# Patient Record
Sex: Female | Born: 1970 | Race: Black or African American | Hispanic: No | Marital: Single | Smoking: Current every day smoker
Health system: Southern US, Community
[De-identification: ages and names within clinical notes are randomized; demographics above are authoritative.]

## PROBLEM LIST (undated history)

## (undated) DIAGNOSIS — I441 Atrioventricular block, second degree: Secondary | ICD-10-CM

## (undated) DIAGNOSIS — J45909 Unspecified asthma, uncomplicated: Secondary | ICD-10-CM

---

## 2017-12-31 ENCOUNTER — Emergency Department
Admission: EM | Admit: 2017-12-31 | Discharge: 2017-12-31 | Disposition: A | Discharge status: Home / Self Care | Payer: Self-pay | Attending: Emergency Medicine | Admitting: Emergency Medicine

## 2017-12-31 DIAGNOSIS — Y929 Unspecified place or not applicable: Secondary | ICD-10-CM | POA: Insufficient documentation

## 2017-12-31 DIAGNOSIS — Y999 Unspecified external cause status: Secondary | ICD-10-CM | POA: Insufficient documentation

## 2017-12-31 DIAGNOSIS — Z5321 Procedure and treatment not carried out due to patient leaving prior to being seen by health care provider: Secondary | ICD-10-CM | POA: Insufficient documentation

## 2017-12-31 DIAGNOSIS — S01511A Laceration without foreign body of lip, initial encounter: Secondary | ICD-10-CM | POA: Insufficient documentation

## 2017-12-31 DIAGNOSIS — Y939 Activity, unspecified: Secondary | ICD-10-CM | POA: Insufficient documentation

## 2017-12-31 DIAGNOSIS — W19XXXA Unspecified fall, initial encounter: Secondary | ICD-10-CM | POA: Insufficient documentation

## 2017-12-31 HISTORY — DX: Unspecified asthma, uncomplicated: J45.909

## 2017-12-31 NOTE — ED Triage Notes (Signed)
Patient reports mechanical fall. Patient has multiple lacerations to inner bottom lip. patient c/o pain to area. Patient denies LOC, dizziness, lightheadedness.

## 2018-01-12 ENCOUNTER — Other Ambulatory Visit: Payer: Self-pay

## 2018-01-12 ENCOUNTER — Emergency Department
Admission: EM | Admit: 2018-01-12 | Discharge: 2018-01-12 | Disposition: A | Discharge status: Home / Self Care | Payer: Self-pay | Attending: Emergency Medicine | Admitting: Emergency Medicine

## 2018-01-12 ENCOUNTER — Emergency Department: Payer: Self-pay

## 2018-01-12 DIAGNOSIS — S29012A Strain of muscle and tendon of back wall of thorax, initial encounter: Secondary | ICD-10-CM | POA: Insufficient documentation

## 2018-01-12 DIAGNOSIS — Y92410 Unspecified street and highway as the place of occurrence of the external cause: Secondary | ICD-10-CM | POA: Insufficient documentation

## 2018-01-12 DIAGNOSIS — F17200 Nicotine dependence, unspecified, uncomplicated: Secondary | ICD-10-CM | POA: Insufficient documentation

## 2018-01-12 DIAGNOSIS — R0789 Other chest pain: Secondary | ICD-10-CM | POA: Insufficient documentation

## 2018-01-12 DIAGNOSIS — Y9389 Activity, other specified: Secondary | ICD-10-CM | POA: Insufficient documentation

## 2018-01-12 DIAGNOSIS — Z91040 Latex allergy status: Secondary | ICD-10-CM | POA: Insufficient documentation

## 2018-01-12 DIAGNOSIS — J45909 Unspecified asthma, uncomplicated: Secondary | ICD-10-CM | POA: Insufficient documentation

## 2018-01-12 DIAGNOSIS — R0781 Pleurodynia: Secondary | ICD-10-CM | POA: Insufficient documentation

## 2018-01-12 DIAGNOSIS — Y999 Unspecified external cause status: Secondary | ICD-10-CM | POA: Insufficient documentation

## 2018-01-12 DIAGNOSIS — M25519 Pain in unspecified shoulder: Secondary | ICD-10-CM | POA: Insufficient documentation

## 2018-01-12 HISTORY — DX: Atrioventricular block, second degree: I44.1

## 2018-01-12 MED ORDER — LORAZEPAM 2 MG/ML IJ SOLN
1.0000 mg | Freq: Once | INTRAMUSCULAR | Status: AC
  Administered 2018-01-12: 1 mg via INTRAVENOUS
  Filled 2018-01-12: qty 1

## 2018-01-12 MED ORDER — NAPROXEN 500 MG PO TABS: 500.0000 mg | ORAL_TABLET | Freq: Two times a day (BID) | ORAL | 0 refills | Status: DC

## 2018-01-12 MED ORDER — DIAZEPAM 5 MG PO TABS: 5.0000 mg | ORAL_TABLET | Freq: Every day | ORAL | 0 refills | Status: AC

## 2018-01-12 MED ORDER — KETOROLAC TROMETHAMINE 30 MG/ML IJ SOLN
15.0000 mg | INTRAMUSCULAR | Status: AC
  Administered 2018-01-12: 15 mg via INTRAVENOUS
  Filled 2018-01-12: qty 1
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
## 2018-01-12 NOTE — ED Notes (Signed)
Pt is drowsy after medication. She is up for d/c and will be monitored until her sister arrives to take her home.
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
## 2018-05-31 ENCOUNTER — Other Ambulatory Visit: Payer: Self-pay

## 2018-05-31 ENCOUNTER — Emergency Department
Admission: EM | Admit: 2018-05-31 | Discharge: 2018-05-31 | Disposition: A | Discharge status: Home / Self Care | Payer: 59 | Attending: Student in an Organized Health Care Education/Training Program | Admitting: Student in an Organized Health Care Education/Training Program

## 2018-05-31 ENCOUNTER — Encounter: Payer: Self-pay | Admitting: Emergency Medicine

## 2018-05-31 DIAGNOSIS — Y929 Unspecified place or not applicable: Secondary | ICD-10-CM | POA: Insufficient documentation

## 2018-05-31 DIAGNOSIS — F17200 Nicotine dependence, unspecified, uncomplicated: Secondary | ICD-10-CM | POA: Diagnosis not present

## 2018-05-31 DIAGNOSIS — S8992XA Unspecified injury of left lower leg, initial encounter: Secondary | ICD-10-CM | POA: Diagnosis present

## 2018-05-31 DIAGNOSIS — Z91040 Latex allergy status: Secondary | ICD-10-CM | POA: Diagnosis not present

## 2018-05-31 DIAGNOSIS — Y999 Unspecified external cause status: Secondary | ICD-10-CM | POA: Insufficient documentation

## 2018-05-31 DIAGNOSIS — S76312A Strain of muscle, fascia and tendon of the posterior muscle group at thigh level, left thigh, initial encounter: Secondary | ICD-10-CM | POA: Insufficient documentation

## 2018-05-31 DIAGNOSIS — X509XXA Other and unspecified overexertion or strenuous movements or postures, initial encounter: Secondary | ICD-10-CM | POA: Insufficient documentation

## 2018-05-31 DIAGNOSIS — Y939 Activity, unspecified: Secondary | ICD-10-CM | POA: Insufficient documentation

## 2018-05-31 DIAGNOSIS — J45909 Unspecified asthma, uncomplicated: Secondary | ICD-10-CM | POA: Diagnosis not present

## 2018-05-31 MED ORDER — CYCLOBENZAPRINE HCL 5 MG PO TABS: 5.0000 mg | ORAL_TABLET | Freq: Three times a day (TID) | ORAL | 0 refills | Status: AC | PRN

## 2018-05-31 MED ORDER — NABUMETONE 750 MG PO TABS: 750.0000 mg | ORAL_TABLET | Freq: Two times a day (BID) | ORAL | 0 refills | Status: AC

## 2018-05-31 NOTE — ED Notes (Signed)

## 2018-05-31 NOTE — ED Provider Notes (Signed)
[facility] Emergency Department Provider Note ____________________________________________  Time seen: 1514  I have reviewed the triage vital signs and the nursing notes.  HISTORY  Chief Complaint  Back Pain and Leg Pain  HPI Shari Fischer is a 48 y.o. female presents herself to the ED for evaluation of left hamstring pain.  Patient describes sudden injury yesterday while she was outside playing with her nieces and nephews.  She describes running, when she felt an immediate pop to the posterior aspect of her thigh near the buttocks.  She denies any fall, trip, or other injury at this time.  She describes disability since that time, but denies any foot drop, bladder or bowel incontinence, or leg swelling.  She also denies any chest pain, shortness of breath, or syncope.  She has not done any alleviating measures for her pain since the onset.  She presents now for further evaluation of a posterior left thigh injury.  Past Medical History:  Diagnosis Date  . Asthma   . Wenckebach     There are no active problems to display for this patient.   Past Surgical History:  Procedure Laterality Date  . CESAREAN SECTION      Prior to Admission medications   Medication Sig Start Date End Date Taking? Authorizing Provider  cyclobenzaprine (FLEXERIL) 5 MG tablet Take 1 tablet (5 mg total) by mouth 3 (three) times daily as needed for muscle spasms. 05/31/18   Destry Dauber, Charlesetta Ivory, PA-C  nabumetone (RELAFEN) 750 MG tablet Take 1 tablet (750 mg total) by mouth 2 (two) times daily. 05/31/18   Thresea Doble, Charlesetta Ivory, PA-C    Allergies Latex and Percocet [oxycodone-acetaminophen]  No family history on file.  Social History Social History   Tobacco Use  . Smoking status: Current Every Day Smoker  . Smokeless tobacco: Never Used  Substance Use Topics  . Alcohol use: Yes  . Drug use: Not on file    Review of Systems  Constitutional: Negative for  fever. Cardiovascular: Negative for chest pain. Respiratory: Negative for shortness of breath. Gastrointestinal: Negative for abdominal pain, vomiting and diarrhea. Genitourinary: Negative for dysuria. Musculoskeletal: Negative for back pain.  Pain as above. Skin: Negative for rash. Neurological: Negative for headaches, focal weakness or numbness. ____________________________________________  PHYSICAL EXAM:  VITAL SIGNS: ED Triage Vitals  Enc Vitals Group     BP 05/31/18 1316 (!) 148/93     Pulse Rate 05/31/18 1316 100     Resp 05/31/18 1316 16     Temp 05/31/18 1316 98.1 F (36.7 C)     Temp Source 05/31/18 1316 Oral     SpO2 05/31/18 1316 96 %     Weight 05/31/18 1312 145 lb (65.8 kg)     Height 05/31/18 1312 5\' 3"  (1.6 m)     Head Circumference --      Peak Flow --      Pain Score 05/31/18 1312 7     Pain Loc --      Pain Edu? --      Excl. in GC? --     Constitutional: Alert and oriented. Well appearing and in no distress. Head: Normocephalic and atraumatic. Eyes: Conjunctivae are normal. Normal extraocular movements Cardiovascular: Normal rate, regular rhythm. Normal distal pulses. Respiratory: Normal respiratory effort. No wheezes/rales/rhonchi. Musculoskeletal: Normal spinal alignment without midline tenderness, spasm, deformity, or step-off.  Patient with no obvious deformity to the left lower extremity.  No palpable cords, muscle defects, or hematoma to the  posterior thigh.  She is able demonstrate normal hip flexion and extension range.  In the prone position, patient is able to engage the hamstring muscle and flex the heel towards the buttocks.  Knee exam is benign at this time. nontender with normal range of motion in all extremities.  Neurologic: Mildly antalgic gait without ataxia.  Normal LE DTRs bilaterally.  Normal speech and language. No gross focal neurologic deficits are appreciated. Skin:  Skin is warm, dry and intact. No rash  noted. ___________________________________________   RADIOLOGY Not indicated ____________________________________________  PROCEDURES  Procedures ____________________________________________  INITIAL IMPRESSION / ASSESSMENT AND PLAN / ED COURSE Patient with ED evaluation of sudden left posterior thigh pain consistent with a hamstring strain.  Patient's exam is overall benign without any acute neuromuscular deficit.  Clinical exam is consistent with a mild hamstring strain.  Patient is discharged with instructions on the self-limited course of this injury.  She also is going to be discharged with prescriptions for Flexeril and Relafen.  She is referred to local community clinic for routine and interim follow-up.  A work note is provided for 1 day as requested. ____________________________________________  FINAL CLINICAL IMPRESSION(S) / ED DIAGNOSES  Final diagnoses:  Hamstring muscle strain, left, initial encounter      Lissa Hoard, PA-C 05/31/18 1625    Willy Eddy, MD 05/31/18 1840

## 2018-05-31 NOTE — Discharge Instructions (Signed)
Your exam is consistent with a hamstring muscle strain. Take the prescription meds as directed. Consider warm, epsom salt baths to relieve muscle pain. Follow-up with one of the local clinics for routine and follow-up medical care.

## 2018-05-31 NOTE — ED Triage Notes (Signed)
Says was running and felt a pop inl elft leg -back of thigh area --and back.

## 2019-08-01 ENCOUNTER — Other Ambulatory Visit: Payer: Self-pay

## 2019-08-01 ENCOUNTER — Encounter: Payer: Self-pay | Admitting: Emergency Medicine

## 2019-08-01 ENCOUNTER — Emergency Department
Admission: EM | Admit: 2019-08-01 | Discharge: 2019-08-01 | Disposition: A | Discharge status: Home / Self Care | Payer: 59 | Attending: Emergency Medicine | Admitting: Emergency Medicine

## 2019-08-01 DIAGNOSIS — Z79899 Other long term (current) drug therapy: Secondary | ICD-10-CM | POA: Insufficient documentation

## 2019-08-01 DIAGNOSIS — J45909 Unspecified asthma, uncomplicated: Secondary | ICD-10-CM | POA: Insufficient documentation

## 2019-08-01 DIAGNOSIS — Z91040 Latex allergy status: Secondary | ICD-10-CM | POA: Insufficient documentation

## 2019-08-01 DIAGNOSIS — F17210 Nicotine dependence, cigarettes, uncomplicated: Secondary | ICD-10-CM | POA: Insufficient documentation

## 2019-08-01 DIAGNOSIS — N63 Unspecified lump in unspecified breast: Secondary | ICD-10-CM | POA: Insufficient documentation

## 2019-08-01 MED ORDER — TRAMADOL HCL 50 MG PO TABS
50.0000 mg | ORAL_TABLET | Freq: Once | ORAL | Status: AC
  Administered 2019-08-01: 50 mg via ORAL
  Filled 2019-08-01: qty 1

## 2019-08-01 MED ORDER — HYDROCODONE-ACETAMINOPHEN 5-325 MG PO TABS: 1.0000 | ORAL_TABLET | Freq: Four times a day (QID) | ORAL | 0 refills | Status: AC | PRN

## 2019-08-01 MED ORDER — NAPROXEN 500 MG PO TABS
500.0000 mg | ORAL_TABLET | Freq: Once | ORAL | Status: AC
  Administered 2019-08-01: 23:00:00 500 mg via ORAL
  Filled 2019-08-01: qty 1

## 2019-08-01 MED ORDER — NAPROXEN 500 MG PO TABS: 500.0000 mg | ORAL_TABLET | Freq: Two times a day (BID) | ORAL | 0 refills | Status: AC

## 2019-08-01 NOTE — Discharge Instructions (Addendum)
Please call and schedule a follow-up appointment with gynecology.  Pain medication has been submitted to your pharmacy.  Please take that as prescribed.  Do not drive or operate any machinery if taking Norco.  I recommend that you wear a sports bra for support.  Return to the emergency department for symptoms that change or worsen if you are unable to schedule appointment.

## 2019-08-01 NOTE — ED Notes (Signed)
Pt states she noticed lump on left breast 4 weeks ago and today it is more painful. Pt resting in bed, warm blanket given.

## 2019-08-01 NOTE — ED Triage Notes (Signed)
Patient presents to the ED with a lump to her left breast that is very tender.  Patient states she noticed the area approx. 4 weeks ago.  Patient is in no obvious distress at this time.

## 2019-08-01 NOTE — ED Notes (Signed)
Pt called sister for ride while this RN present. Pt states sister is coming to pick her up.

## 2019-08-01 NOTE — ED Provider Notes (Signed)
[facility] Emergency Department Provider Note ____________________________________________   First MD Initiated Contact with Patient 08/01/19 2203     (approximate)  I have reviewed the triage vital signs and the nursing notes.   HISTORY  Chief Complaint Breast Mass  HPI Shari Fischer is a 49 y.o. female presents to the emergency department for evaluation of left breast tenderness.  She states that she noticed that 3 to 4 weeks ago.  It does not seem to change in relation to her menstrual cycle.  Her last menstrual cycle was the middle of April.  She has not noticed any discharge from her nipple or peeling around the nipple.  No immediate family history of breast cancer.  She has not been evaluated for this prior.  She does not have a regular doctor or gynecologist.  She does state that she had a breast cyst on that side several years ago that was drained but has not had any complications since then.         Past Medical History:  Diagnosis Date  . Asthma   . Wenckebach     There are no problems to display for this patient.   Past Surgical History:  Procedure Laterality Date  . CESAREAN SECTION      Prior to Admission medications   Medication Sig Start Date End Date Taking? Authorizing Provider  cyclobenzaprine (FLEXERIL) 5 MG tablet Take 1 tablet (5 mg total) by mouth 3 (three) times daily as needed for muscle spasms. 05/31/18   Menshew, Dannielle Karvonen, PA-C  HYDROcodone-acetaminophen (NORCO/VICODIN) 5-325 MG tablet Take 1 tablet by mouth every 6 (six) hours as needed for up to 3 days for severe pain. 08/01/19 08/04/19  Jesalyn Finazzo, Johnette Abraham B, FNP  nabumetone (RELAFEN) 750 MG tablet Take 1 tablet (750 mg total) by mouth 2 (two) times daily. 05/31/18   Menshew, Dannielle Karvonen, PA-C  naproxen (NAPROSYN) 500 MG tablet Take 1 tablet (500 mg total) by mouth 2 (two) times daily with a meal. 08/01/19   Beatric Fulop B, FNP    Allergies Latex and Percocet  [oxycodone-acetaminophen]  No family history on file.  Social History Social History   Tobacco Use  . Smoking status: Current Every Day Smoker  . Smokeless tobacco: Never Used  Substance Use Topics  . Alcohol use: Yes  . Drug use: Not on file    Review of Systems  Constitutional: No fever/chills Eyes: No visual changes. ENT: No sore throat. Cardiovascular: Denies chest pain. Respiratory: Denies shortness of breath. Gastrointestinal: No abdominal pain.  No nausea, no vomiting.  No diarrhea.  No constipation. Genitourinary: Negative for dysuria. Musculoskeletal: Negative for back pain. Skin: Positive for lump in left breast. Neurological: Negative for headaches, focal weakness or numbness. ____________________________________________   PHYSICAL EXAM:  VITAL SIGNS: ED Triage Vitals  Enc Vitals Group     BP 08/01/19 1902 (!) 169/111     Pulse Rate 08/01/19 1902 72     Resp 08/01/19 1902 16     Temp 08/01/19 1902 98.5 F (36.9 C)     Temp Source 08/01/19 1902 Oral     SpO2 08/01/19 1902 100 %     Weight 08/01/19 1903 147 lb (66.7 kg)     Height 08/01/19 1903 5' 3.5" (1.613 m)     Head Circumference --      Peak Flow --      Pain Score 08/01/19 1903 6     Pain Loc --  Pain Edu? --      Excl. in GC? --     Constitutional: Alert and oriented. Well appearing and in no acute distress. Eyes: Conjunctivae are normal. PERRL. EOMI. Head: Atraumatic. Nose: No congestion/rhinnorhea. Mouth/Throat: Mucous membranes are moist.  Oropharynx non-erythematous. Neck: No stridor.   Hematological/Lymphatic/Immunilogical: No cervical lymphadenopathy. Cardiovascular: Normal rate, regular rhythm. Grossly normal heart sounds.  Good peripheral circulation. Respiratory: Normal respiratory effort.  No retractions. Lungs CTAB. Gastrointestinal: Soft and nontender. No distention. No abdominal bruits. No CVA tenderness. Genitourinary:  Musculoskeletal: No lower extremity tenderness nor  edema.  No joint effusions. Neurologic:  Normal speech and language. No gross focal neurologic deficits are appreciated. No gait instability. Skin: Palpable, nonmobile mass within the areola of the left breast at approximately the 11 to 12 o'clock position. Psychiatric: Mood and affect are normal. Speech and behavior are normal.  ____________________________________________   LABS (all labs ordered are listed, but only abnormal results are displayed)  Labs Reviewed - No data to display ____________________________________________  EKG  Not indicated ____________________________________________  RADIOLOGY  ED MD interpretation:   Official radiology report(s): No results found.  ____________________________________________   PROCEDURES  Procedure(s) performed (including Critical Care):  Procedures  ____________________________________________   INITIAL IMPRESSION / ASSESSMENT AND PLAN     49 year old female presenting to the emergency department for treatment and evaluation of left breast pain.  See HPI for further details.  DIFFERENTIAL DIAGNOSIS  Fibrocystic changes, benign or cancerous mass, cyst  ED COURSE  No indication of abscess on exam but there is a palpable, nonmobile lump at the 11 to 12 o'clock position in the areola.  No discharge noted at the nipple.  No nipple cracking or bleeding.  Patient was advised that she will likely feel better if she wears a sports bra to help with support and compression.  She was strongly advised to schedule an appointment with gynecology and will be given a referral tonight.  She will be prescribed Norco and Naprosyn.  She is to return to the emergency department for acute changes of concern. ____________________________________________   FINAL CLINICAL IMPRESSION(S) / ED DIAGNOSES  Final diagnoses:  Breast lump or mass     ED Discharge Orders         Ordered    HYDROcodone-acetaminophen (NORCO/VICODIN) 5-325 MG  tablet  Every 6 hours PRN     08/01/19 2221    naproxen (NAPROSYN) 500 MG tablet  2 times daily with meals     08/01/19 2221           Shari Fischer was evaluated in Emergency Department on 08/01/2019 for the symptoms described in the history of present illness. She was evaluated in the context of the global COVID-19 pandemic, which necessitated consideration that the patient might be at risk for infection with the SARS-CoV-2 virus that causes COVID-19. Institutional protocols and algorithms that pertain to the evaluation of patients at risk for COVID-19 are in a state of rapid change based on information released by regulatory bodies including the CDC and federal and state organizations. These policies and algorithms were followed during the patient's care in the ED.   Note:  This document was prepared using Dragon voice recognition software and may include unintentional dictation errors.   Chinita Pester, FNP 08/01/19 2353    Emily Filbert, MD 08/02/19 1459

## 2019-08-02 ENCOUNTER — Other Ambulatory Visit: Payer: Self-pay | Admitting: Certified Nurse Midwife

## 2019-08-02 DIAGNOSIS — N6320 Unspecified lump in the left breast, unspecified quadrant: Secondary | ICD-10-CM

## 2019-08-28 ENCOUNTER — Other Ambulatory Visit: Payer: Self-pay

## 2019-08-28 ENCOUNTER — Ambulatory Visit: Payer: Self-pay | Attending: Oncology | Admitting: *Deleted

## 2019-08-28 ENCOUNTER — Encounter: Payer: Self-pay | Admitting: *Deleted

## 2019-08-28 VITALS — BP 147/99 | HR 77 | Temp 97.2°F | Ht 62.5 in | Wt 147.2 lb

## 2019-08-28 DIAGNOSIS — N644 Mastodynia: Secondary | ICD-10-CM

## 2019-08-28 NOTE — Progress Notes (Signed)
  Subjective:     Patient ID: Shari Fischer, female   DOB: 04-02-1970, 49 y.o.   MRN: [id number]  HPI   Review of Systems     Objective:   Physical Exam Chest:     Breasts:        Right: Inverted nipple present. No swelling, bleeding, mass, nipple discharge, skin change or tenderness.        Left: Nipple discharge, skin change and tenderness present. No swelling, bleeding, inverted nipple or mass.    Lymphadenopathy:     Upper Body:     Right upper body: No supraclavicular, axillary or pectoral adenopathy.     Left upper body: Axillary adenopathy present. No supraclavicular or pectoral adenopathy.        Assessment:     49 year old Black female referred to [facility] by Dr. Fay Records for a new lump in the left breast.  Patient states she felt "something wasn't just right back March".  States her breast became painful, and she saw Dr. Particia Nearing a few weeks ago and was referred to [place].  On clinical breast exam I can visualize an approximate 4-5 cm area of thickening and redness from 9-12:00 left breast.  The nipple areola complex is thick with a slight raised area at 9:00 in the areola.  The area was so painful to palpation, it was difficult to examine her breast thoroughly.  I could not palpate a discrete mass.  I can palpate an approximate 1 cm firm mobile lymph node in the left axilla.  The patient does not remember how long the area of concern has been red.  Patient states she had an area in the left breast to drain over the weekend.  There is no drainage on exam today.  Patient is tearful and states "I'm scared."  No family history of breast cancer.  Discussed that I felt it would be prudent to have her see a surgeon prior to scheduling her mammogram, so if this is possibly an infection it could be treated quickly, and if needed, the surgeon can also do a skin biopsy.  I don't think the patient could tolerate the mammogram compression at this time.  Explained that she will get scheduled for  a diagnostic mammogram and ultrasound.  She was encouraged to go by [facility] and sign a consent for release so her previous images from [facility] could obtained.  Patient has been screened for eligibility.  She does not have any insurance, Medicare or Medicaid.  She also meets financial eligibility.   Risk Assessment    Risk Scores      08/28/2019   Last edited by: Alta Corning, CMA   5-year risk: 1 %   Lifetime risk: 9.1 %            Plan:     Patient is scheduled to see Dr. Lemar Livings on 08/29/19 @ 11:15.  She was encouraged to arrive at least 15 minutes early.  Orders place for bilateral diagnostic mammogram and ultrasound.  Will get Joellyn Quails to schedule mammogram appointment.  Will follow up per [facility] protocol.

## 2019-08-28 NOTE — Patient Instructions (Signed)
Gave patient hand-out, Women Staying Healthy, Active and Well from [facility], with education on breast health, pap smears, heart and colon health. 

## 2019-08-29 ENCOUNTER — Encounter: Payer: Self-pay | Admitting: *Deleted

## 2019-08-29 NOTE — Progress Notes (Signed)
Talked to Asher Muir in the [facility].  She is going to get patients imaging from [facility] and call schedule patients diagnostic mammogram and ultrasound.

## 2019-08-30 ENCOUNTER — Inpatient Hospital Stay
Admission: RE | Admit: 2019-08-30 | Discharge: 2019-08-30 | Disposition: A | Discharge status: Home / Self Care | Payer: Self-pay | Source: Ambulatory Visit | Attending: *Deleted | Admitting: *Deleted

## 2019-08-30 ENCOUNTER — Other Ambulatory Visit: Payer: Self-pay | Admitting: *Deleted

## 2019-08-30 DIAGNOSIS — Z1231 Encounter for screening mammogram for malignant neoplasm of breast: Secondary | ICD-10-CM

## 2019-09-24 ENCOUNTER — Encounter: Payer: Self-pay | Admitting: *Deleted

## 2019-09-24 NOTE — Progress Notes (Signed)
Tried to call patient to schedule her mammogram.  No answer and no voicemail.  Will try again at a later time.

## 2019-10-07 ENCOUNTER — Encounter: Payer: Self-pay | Admitting: *Deleted

## 2019-10-07 NOTE — Progress Notes (Signed)
Patient has resolving left breast abscess.  She has not schedule her mammogram.  I have left messages, but she has not returned my calls.  Certified letter mailed to encourage her to schedule her mammogram.  If no response will close case as refusal to follow up.

## 2019-11-04 ENCOUNTER — Encounter: Payer: Self-pay | Admitting: *Deleted

## 2019-11-04 NOTE — Progress Notes (Signed)
Received unclaimed certified letter.  Will close case as lost to follow-up.
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
## 2022-11-01 ENCOUNTER — Other Ambulatory Visit: Payer: Self-pay

## 2022-11-01 ENCOUNTER — Encounter (HOSPITAL_COMMUNITY): Payer: Self-pay

## 2022-11-01 ENCOUNTER — Emergency Department (HOSPITAL_COMMUNITY)
Admission: EM | Admit: 2022-11-01 | Discharge: 2022-11-01 | Disposition: A | Discharge status: Home / Self Care | Payer: BLUE CROSS/BLUE SHIELD | Attending: Emergency Medicine | Admitting: Emergency Medicine

## 2022-11-01 DIAGNOSIS — Z91040 Latex allergy status: Secondary | ICD-10-CM | POA: Diagnosis not present

## 2022-11-01 DIAGNOSIS — Z79899 Other long term (current) drug therapy: Secondary | ICD-10-CM | POA: Diagnosis not present

## 2022-11-01 DIAGNOSIS — J45909 Unspecified asthma, uncomplicated: Secondary | ICD-10-CM | POA: Insufficient documentation

## 2022-11-01 DIAGNOSIS — I10 Essential (primary) hypertension: Secondary | ICD-10-CM | POA: Diagnosis present

## 2022-11-01 LAB — CBC WITH DIFFERENTIAL/PLATELET
Abs Immature Granulocytes: 0.01 10*3/uL (ref 0.00–0.07)
Basophils Absolute: 0 10*3/uL (ref 0.0–0.1)
Basophils Relative: 1 %
Eosinophils Absolute: 0.1 10*3/uL (ref 0.0–0.5)
Eosinophils Relative: 1 %
HCT: 43.1 % (ref 36.0–46.0)
Hemoglobin: 13.6 g/dL (ref 12.0–15.0)
Immature Granulocytes: 0 %
Lymphocytes Relative: 30 %
Lymphs Abs: 1.6 10*3/uL (ref 0.7–4.0)
MCH: 28.6 pg (ref 26.0–34.0)
MCHC: 31.6 g/dL (ref 30.0–36.0)
MCV: 90.5 fL (ref 80.0–100.0)
Monocytes Absolute: 0.5 10*3/uL (ref 0.1–1.0)
Monocytes Relative: 10 %
Neutro Abs: 3 10*3/uL (ref 1.7–7.7)
Neutrophils Relative %: 58 %
Platelets: 382 10*3/uL (ref 150–400)
RBC: 4.76 MIL/uL (ref 3.87–5.11)
RDW: 14.3 % (ref 11.5–15.5)
WBC: 5.2 10*3/uL (ref 4.0–10.5)
nRBC: 0 % (ref 0.0–0.2)

## 2022-11-01 LAB — BASIC METABOLIC PANEL
Anion gap: 9 (ref 5–15)
BUN: 9 mg/dL (ref 6–20)
CO2: 22 mmol/L (ref 22–32)
Calcium: 8.8 mg/dL — ABNORMAL LOW (ref 8.9–10.3)
Chloride: 107 mmol/L (ref 98–111)
Creatinine, Ser: 0.87 mg/dL (ref 0.44–1.00)
GFR, Estimated: >60 mL/min (ref >60)
Glucose, Bld: 99 mg/dL (ref 70–99)
Potassium: 3.9 mmol/L (ref 3.5–5.1)
Sodium: 138 mmol/L (ref 135–145)

## 2022-11-01 LAB — URINALYSIS, ROUTINE W REFLEX MICROSCOPIC
Bilirubin Urine: NEGATIVE
Glucose, UA: NEGATIVE mg/dL
Ketones, ur: NEGATIVE mg/dL
Leukocytes,Ua: NEGATIVE
Nitrite: NEGATIVE
Protein, ur: 30 mg/dL — AB
Specific Gravity, Urine: 1.013 (ref 1.005–1.030)
pH: 5 (ref 5.0–8.0)

## 2022-11-01 MED ORDER — AMLODIPINE BESYLATE 10 MG PO TABS: 10.0000 mg | ORAL_TABLET | Freq: Every day | ORAL | 2 refills | Status: AC

## 2022-11-01 NOTE — Discharge Instructions (Addendum)
It was a pleasure taking care of you today!  Your labs were unremarkable today. You will be sent a prescription for Amlodipine, take as directed. It is important that you keep a log of your blood pressure (see sheet attached) to give to your Primary Care Provider.  Attached is further information on how to take your blood pressure at home.  You may use an over-the-counter blood pressure monitor.  Return to emergency department if you experience increasing signs worsening headache, vision changes, chest pain, trouble breathing, worsening symptoms.   Ensure to maintain fluid intake.

## 2022-11-01 NOTE — ED Triage Notes (Signed)
Pt reports she was sent here by her PCP due to hypertension. Pt has no complaints and is not taking any blood pressure medication.

## 2022-11-01 NOTE — ED Provider Notes (Signed)
[place] EMERGENCY DEPARTMENT AT [facility] Provider Note   CSN: [id number] Arrival date & time: 11/01/22  1218     History  Chief Complaint  Patient presents with   Hypertension    Shari Fischer is a 52 y.o. female with a past medical history of asthma, wenckebach, who presents emergency department with concerns for elevated blood pressure onset today prior to arrival.  Was at her primary care provider's office for follow-up appointment when her blood pressure was elevated to 178 systolic.  Was informed by her primary care provider to come to the emergency department for a drip of antihypertensives due to her elevated blood pressure.  Patient denies chest pain, shortness of breath, abdominal pain, nausea, vomiting, numbness, tingling, dizziness, lightheadedness, headache, blurred vision, diplopia, urinary symptoms.   Per patient chart review: Patient was evaluated by her primary care provider today with a systolic blood pressure of 178.  Patient was not started on any antihypertensives by her primary care provider today.  Patient was instructed to come into the emergency department for evaluation of her elevated blood pressure.  The history is provided by the patient. No language interpreter was used.       Home Medications Prior to Admission medications   Medication Sig Start Date End Date Taking? Authorizing Provider  amLODipine (NORVASC) 10 MG tablet Take 1 tablet (10 mg total) by mouth daily. 11/01/22  Yes Ailey Wessling A, PA-C  cyclobenzaprine (FLEXERIL) 5 MG tablet Take 1 tablet (5 mg total) by mouth 3 (three) times daily as needed for muscle spasms. Patient not taking: Reported on 11/01/2022 05/31/18   Menshew, Charlesetta Ivory, PA-C  meloxicam (MOBIC) 15 MG tablet Take 15 mg by mouth daily. Patient not taking: Reported on 11/01/2022 10/04/22   [provider]  nabumetone (RELAFEN) 750 MG tablet Take 1 tablet (750 mg total) by mouth 2 (two) times daily. Patient  not taking: Reported on 11/01/2022 05/31/18   Menshew, Charlesetta Ivory, PA-C  naproxen (NAPROSYN) 500 MG tablet Take 1 tablet (500 mg total) by mouth 2 (two) times daily with a meal. Patient not taking: Reported on 11/01/2022 08/01/19   Chinita Pester, FNP      Allergies    Oxycodone-acetaminophen and Latex    Review of Systems   Review of Systems  All other systems reviewed and are negative.   Physical Exam Updated Vital Signs BP (!) 175/115   Pulse 80   Temp 98.2 F (36.8 C) (Oral)   Resp 18   SpO2 98%  Physical Exam Vitals and nursing note reviewed.  Constitutional:      General: She is not in acute distress.    Appearance: Normal appearance.  Eyes:     General: No scleral icterus.    Extraocular Movements: Extraocular movements intact.  Cardiovascular:     Rate and Rhythm: Normal rate and regular rhythm.     Pulses: Normal pulses.     Heart sounds: Normal heart sounds.  Pulmonary:     Effort: Pulmonary effort is normal. No respiratory distress.     Breath sounds: Normal breath sounds.  Abdominal:     Palpations: Abdomen is soft. There is no mass.     Tenderness: There is no abdominal tenderness.  Musculoskeletal:        General: Normal range of motion.     Cervical back: Neck supple.  Skin:    General: Skin is warm and dry.     Findings: No rash.  Neurological:     Mental Status: She is alert.     Sensory: Sensation is intact.     Motor: Motor function is intact.     Comments: No focal neurological deficits. Negative pronator drift. Able to ambulate without assistance or difficulty. Strength and sensation intact to BUE and BLE. Grip strength 5/5 bilaterally.  Normal finger-nose testing.  Normal heel-to-shin testing.  Cranial nerves II through XII intact.  Psychiatric:        Behavior: Behavior normal.     ED Results / Procedures / Treatments   Labs (all labs ordered are listed, but only abnormal results are displayed) Labs Reviewed  BASIC METABOLIC PANEL -  Abnormal; Notable for the following components:      Result Value   Calcium 8.8 (*)    All other components within normal limits  URINALYSIS, ROUTINE W REFLEX MICROSCOPIC - Abnormal; Notable for the following components:   APPearance CLOUDY (*)    Hgb urine dipstick LARGE (*)    Protein, ur 30 (*)    Bacteria, UA FEW (*)    All other components within normal limits  CBC WITH DIFFERENTIAL/PLATELET    EKG EKG Interpretation Date/Time:  Tuesday November 01 2022 12:43:33 EDT Ventricular Rate:  64 PR Interval:  217 QRS Duration:  85 QT Interval:  419 QTC Calculation: 433 R Axis:   -8  Text Interpretation: Sinus rhythm Prolonged PR interval Left ventricular hypertrophy Confirmed by Alvester Chou [phone]) on 11/01/2022 12:50:52 PM  Radiology No results found.  Procedures Procedures    Medications Ordered in ED Medications - No data to display  ED Course/ Medical Decision Making/ A&P Clinical Course as of 11/01/22 1357  Tue Nov 01, 2022  1347 Re-evaluated and resting comfortably on stretcher.  Discussed with patient lab findings.  Patient is that she is starting her menstrual cycle today.  Offered CT head, patient declines at this time. Discussed discharge treatment plan. Pt agreeable at this time. Pt appears safe for discharge. [SB]    Clinical Course User Index [SB] Isabella Roemmich A, PA-C                                 Medical Decision Making Amount and/or Complexity of Data Reviewed Labs: ordered.  Risk Prescription drug management.   Pt presents with elevated blood pressure onset today.  Patient saw her primary care provider today and had elevated blood pressure and was told to come to the emergency department.  She was not started on any antihypertensives by her primary care provider. Vital signs, pt afebrile. On exam, pt with no focal neurological deficits. No acute cardiovascular, respiratory, abdominal exam findings. Differential diagnosis includes CVA, TIA,  electrolyte abnormality, arrhythmia.    Co morbidities that complicate the patient evaluation: Asthma, Wenckebach  Additional history obtained:  External records from outside source obtained and reviewed including: Patient was evaluated by her primary care provider today with a systolic blood pressure of 178.  Patient was not started on any antihypertensives by her primary care provider today.  Patient was instructed to come into the emergency department for evaluation of her elevated blood pressure.  Labs:  I ordered, and personally interpreted labs.  The pertinent results include:   CBC unremarkable BMP unremarkable Urinalysis with large amount of hemoglobin (patient is currently on her menstrual cycle) otherwise negative   Disposition: Presenting suspicious for hypertension.  No concerns this time for CVA, TIA, electrolyte  abnormality, arrhythmia. After consideration of the diagnostic results and the patients response to treatment, I feel that the patient would benefit from Discharge home.  Patient started on amlodipine.  Patient instructed to take her blood pressure at home up to 3-4 times a week and keep a log to show to her primary care provider.  Discussed with patient importance of follow-up with her primary care provider.  Offered work note, patient declined at this time.  Supportive care measures and strict return precautions discussed with patient at bedside. Pt acknowledges and verbalizes understanding. Pt appears safe for discharge. Follow up as indicated in discharge paperwork.    This chart was dictated using voice recognition software, Dragon. Despite the best efforts of this provider to proofread and correct errors, errors may still occur which can change documentation meaning.  Final Clinical Impression(s) / ED Diagnoses Final diagnoses:  Hypertension, unspecified type    Rx / DC Orders ED Discharge Orders          Ordered    amLODipine (NORVASC) 10 MG tablet  Daily         11/01/22 1355              Sadrac Zeoli A, PA-C 11/01/22 1358    Terald Sleeper, MD 11/01/22 1635
